# Patient Record
Sex: Female | Born: 1970 | Race: White | Hispanic: No | Marital: Married
Health system: Southern US, Community
[De-identification: ages and names within clinical notes are randomized; demographics above are authoritative.]

---

## 2006-03-25 ENCOUNTER — Emergency Department: Payer: Self-pay | Admitting: Emergency Medicine

## 2006-06-08 ENCOUNTER — Observation Stay: Payer: Self-pay | Admitting: Obstetrics and Gynecology

## 2006-06-10 ENCOUNTER — Observation Stay: Payer: Self-pay | Admitting: Obstetrics and Gynecology

## 2006-07-14 ENCOUNTER — Ambulatory Visit: Payer: Self-pay | Admitting: Obstetrics and Gynecology

## 2006-08-17 ENCOUNTER — Inpatient Hospital Stay: Payer: Self-pay | Admitting: Obstetrics and Gynecology

## 2006-08-23 ENCOUNTER — Ambulatory Visit: Payer: Self-pay | Admitting: Pediatrics

## 2011-09-18 ENCOUNTER — Ambulatory Visit: Payer: Self-pay | Admitting: Obstetrics and Gynecology

## 2011-09-19 ENCOUNTER — Ambulatory Visit: Payer: Self-pay | Admitting: Obstetrics and Gynecology

## 2011-11-13 ENCOUNTER — Ambulatory Visit: Payer: Self-pay | Admitting: Anesthesiology

## 2011-11-13 DIAGNOSIS — I10 Essential (primary) hypertension: Secondary | ICD-10-CM

## 2011-11-13 LAB — CBC WITH DIFFERENTIAL/PLATELET
Basophil #: 0.1 10*3/uL (ref 0.0–0.1)
Eosinophil #: 0.2 10*3/uL (ref 0.0–0.7)
Eosinophil %: 2.1 %
HCT: 35.8 % (ref 35.0–47.0)
Lymphocyte %: 35.5 %
MCV: 84 fL (ref 80–100)
Monocyte %: 6.4 %
Neutrophil #: 4.1 10*3/uL (ref 1.4–6.5)
Neutrophil %: 54.9 %
Platelet: 281 10*3/uL (ref 150–440)
RDW: 15.3 % — ABNORMAL HIGH (ref 11.5–14.5)
WBC: 7.4 10*3/uL (ref 3.6–11.0)

## 2011-11-13 LAB — BASIC METABOLIC PANEL
BUN: 10 mg/dL (ref 7–18)
Calcium, Total: 9 mg/dL (ref 8.5–10.1)
Chloride: 104 mmol/L (ref 98–107)
Co2: 24 mmol/L (ref 21–32)
Glucose: 113 mg/dL — ABNORMAL HIGH (ref 65–99)
Osmolality: 279 (ref 275–301)
Potassium: 3.5 mmol/L (ref 3.5–5.1)
Sodium: 140 mmol/L (ref 136–145)

## 2011-11-19 ENCOUNTER — Ambulatory Visit: Payer: Self-pay | Admitting: Surgery

## 2011-11-19 LAB — PREGNANCY, URINE: Pregnancy Test, Urine: NEGATIVE m[IU]/mL

## 2011-11-24 LAB — PATHOLOGY REPORT

## 2012-05-26 ENCOUNTER — Ambulatory Visit: Payer: Self-pay | Admitting: Surgery

## 2013-05-27 ENCOUNTER — Ambulatory Visit: Payer: Self-pay | Admitting: Family Medicine

## 2014-02-18 IMAGING — US ULTRASOUND RIGHT BREAST
1 series · 14 of 25 positions shown · non-contrast
Comparison: none

REASON FOR EXAM: AV RT PARENCHYMAL DENSITY
COMMENTS:

PROCEDURE:     US  - US BREAST RIGHT  - September 19, 2011 [DATE]
RESULT:     Reference is made to mammogram report which incorporates
ultrasound findings.

[Series 1: ultrasound right breast · 0.08mm/px · 32 acquisitions, 14 frames shown]
[im 1/32]
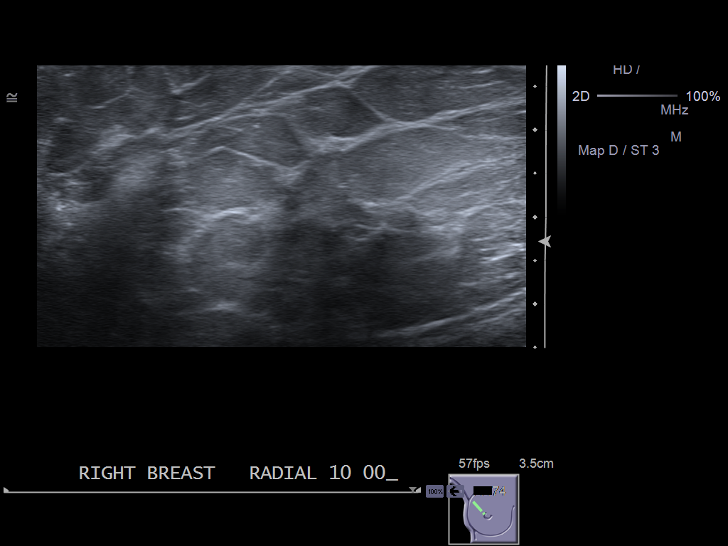
[im 3/32]
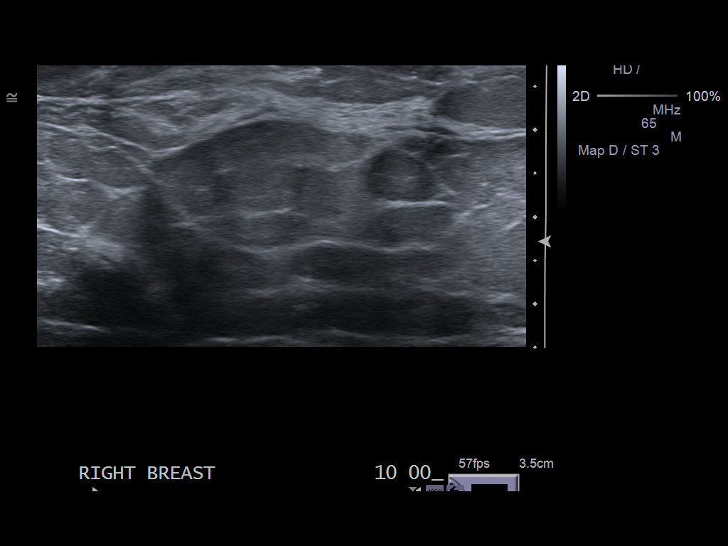
[im 6/32]
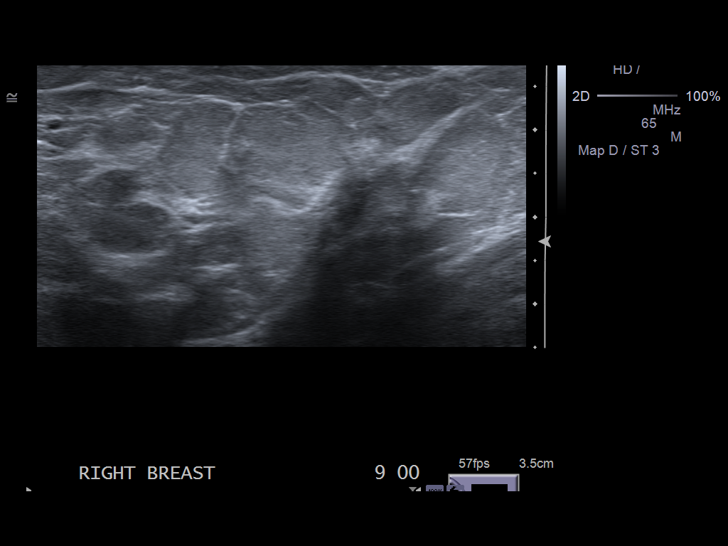
[im 8/32]
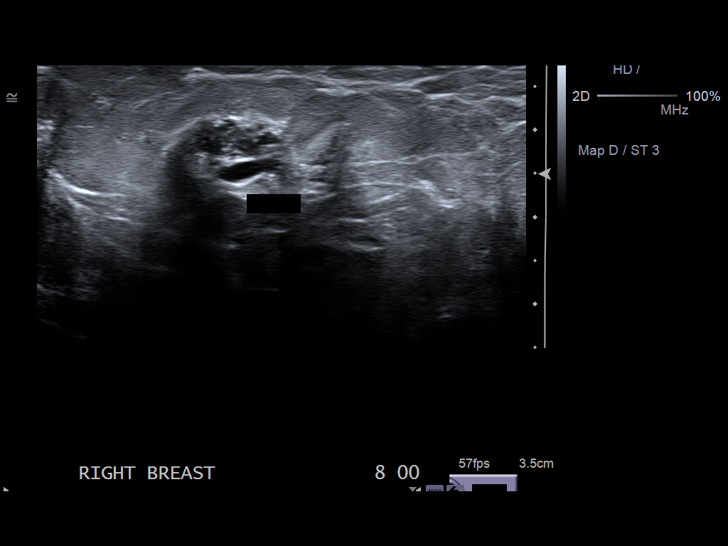
[im 11/32]
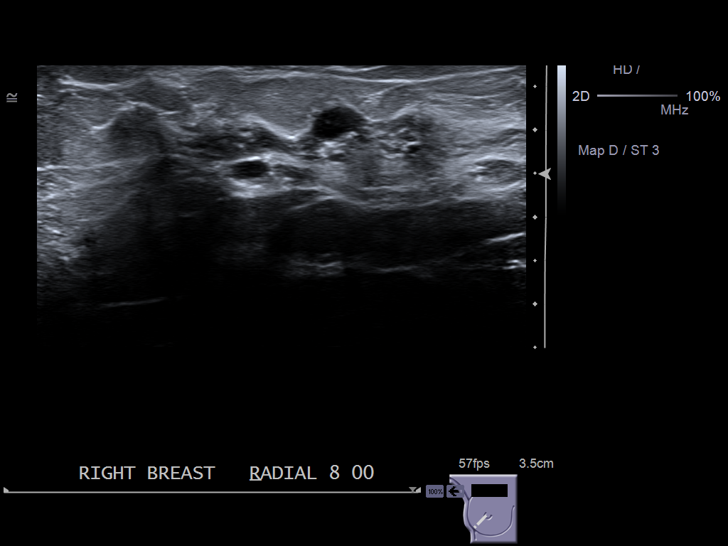
[im 12/32]
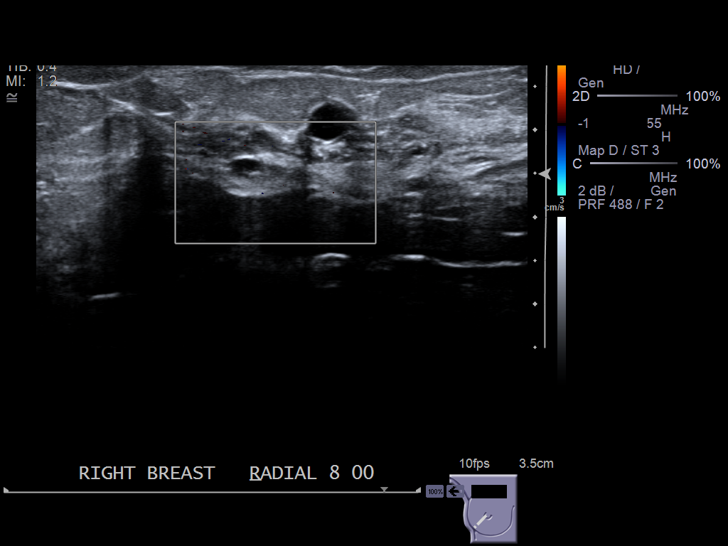
[im 15/32]
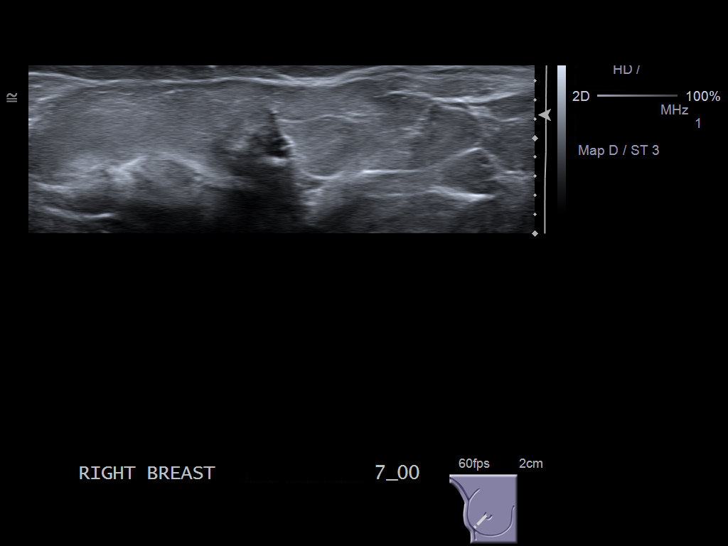
[im 17/32]
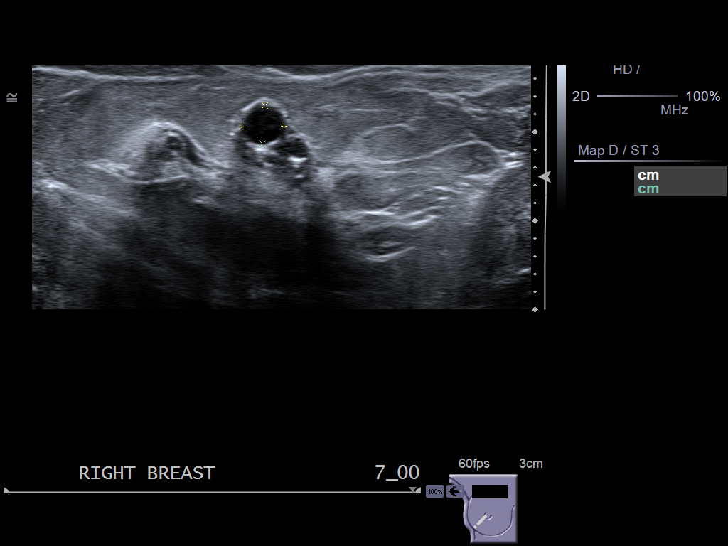
[im 20/32]
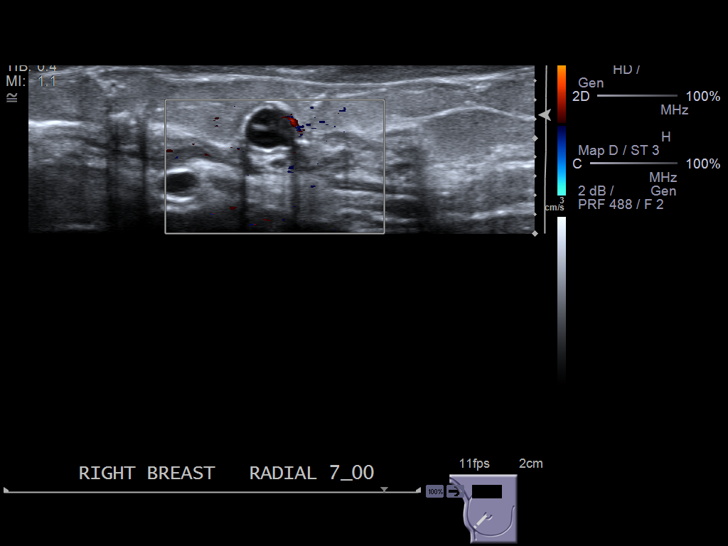
[im 21/32]
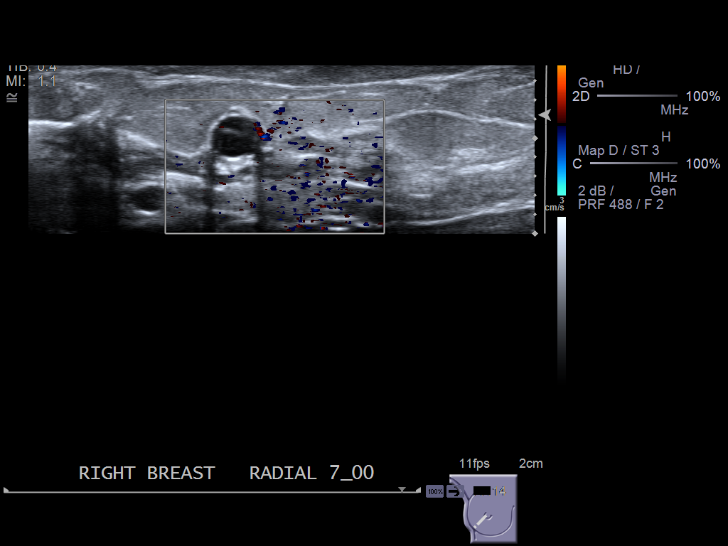
[im 24/32]
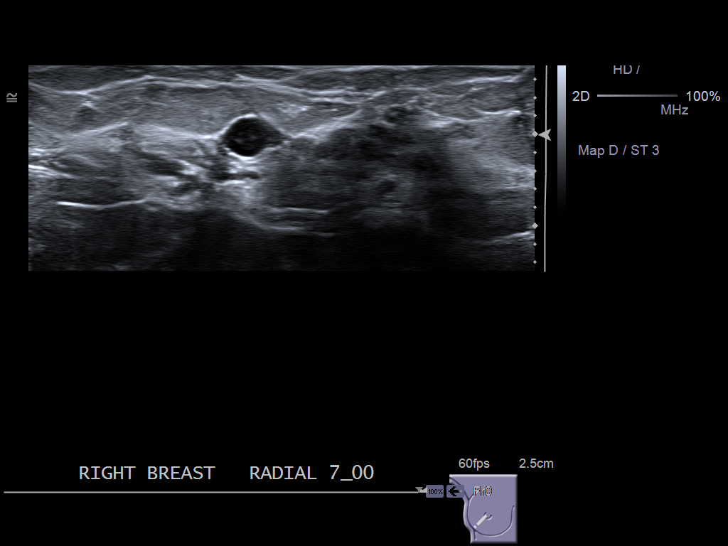
[im 26/32]
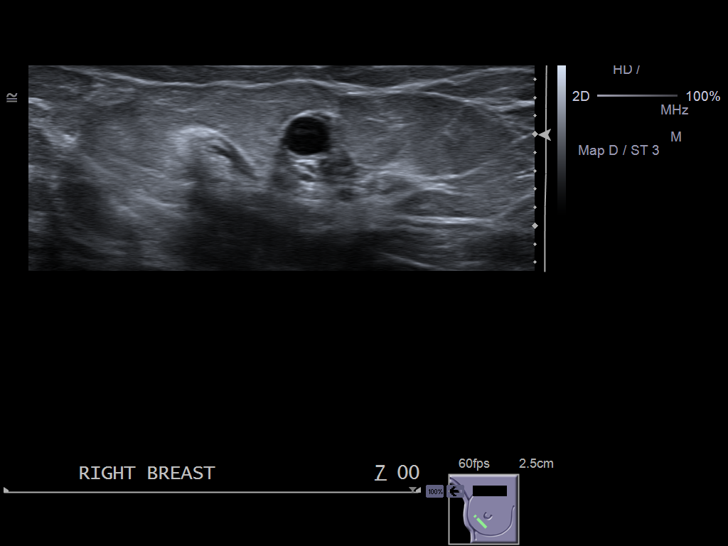
[im 29/32]
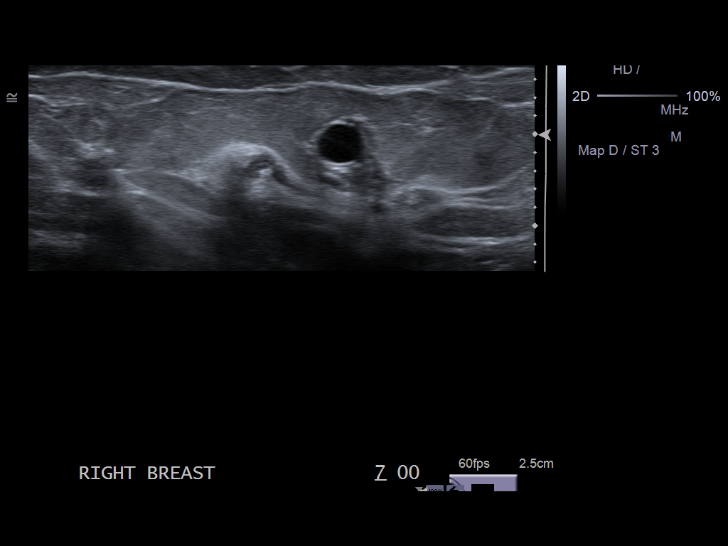
[im 32/32]
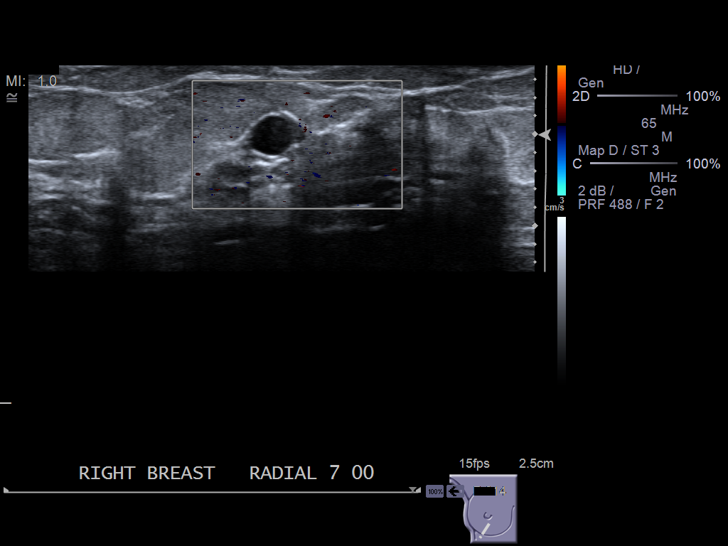

[14 of 25 positions shown; findings below may reference images not displayed]

IMPRESSION: BI-RADS: Category 4 - Suspicious Abnormality.

## 2014-04-20 IMAGING — US US NEEDLE LOCALIZATION*R*
1 series · 13 of 14 positions shown · non-contrast
Comparison: none

REASON FOR EXAM: right breast mass
COMMENTS:

[Series 1: us needle localization*right* · 0.08mm/px · 14 acquisitions, 13 frames shown]
[im 1/14]
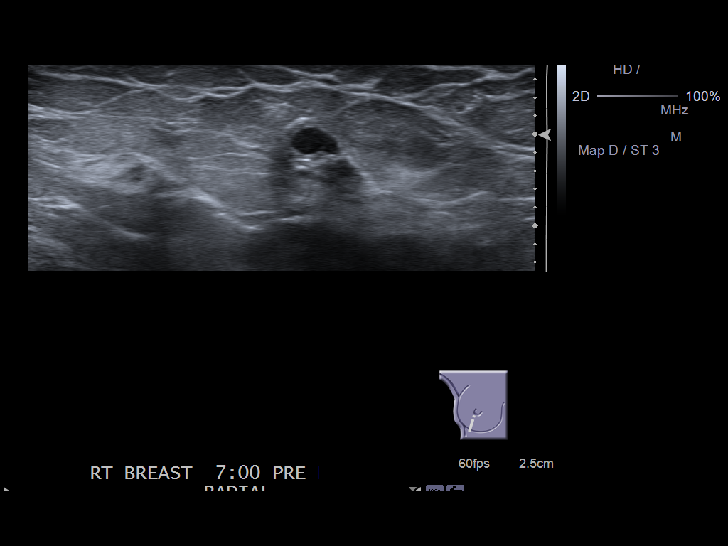
[im 2/14]
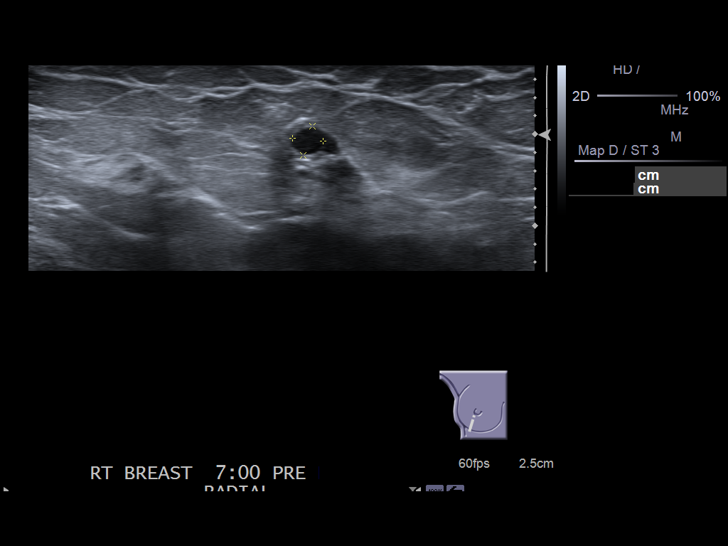
[im 3/14]
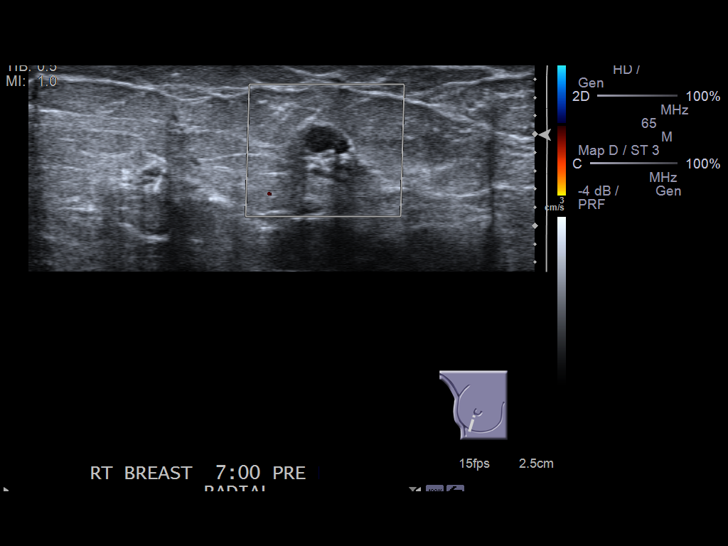
[im 4/14]
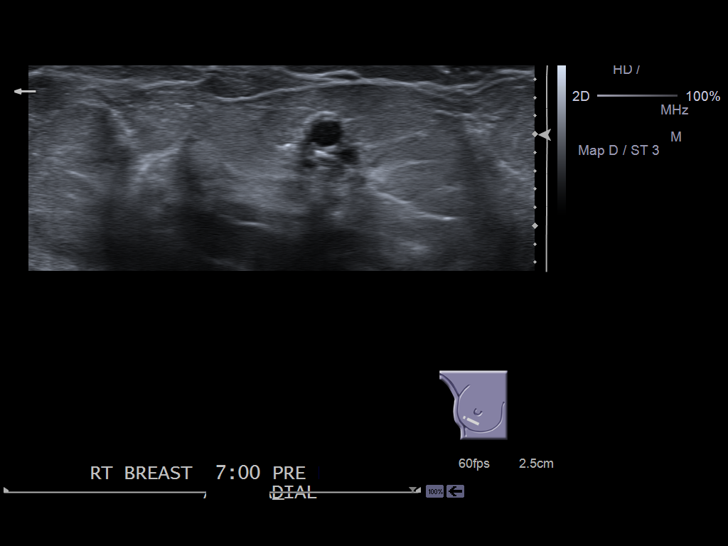
[im 5/14]
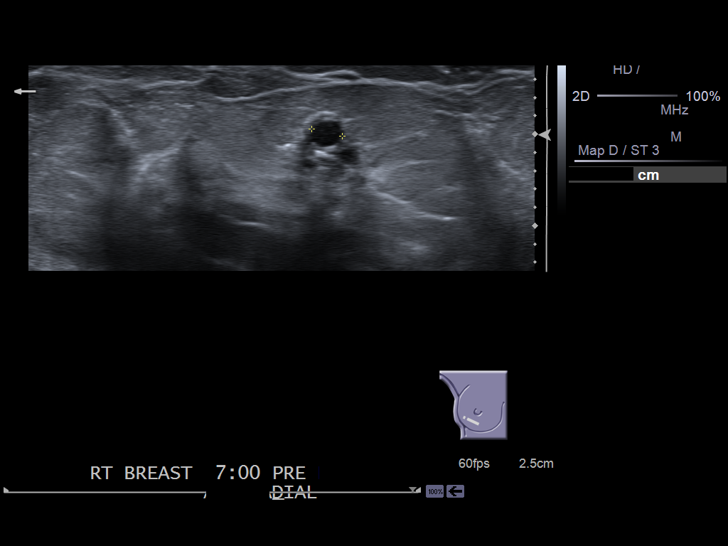
[im 6/14]
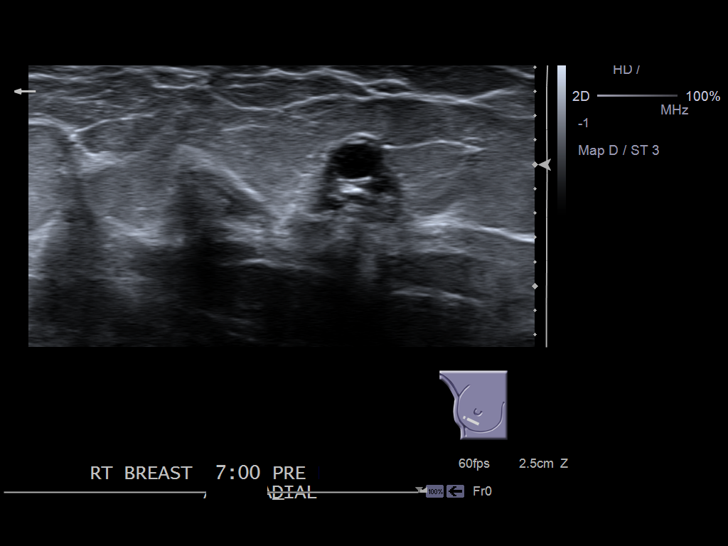
[im 8/14]
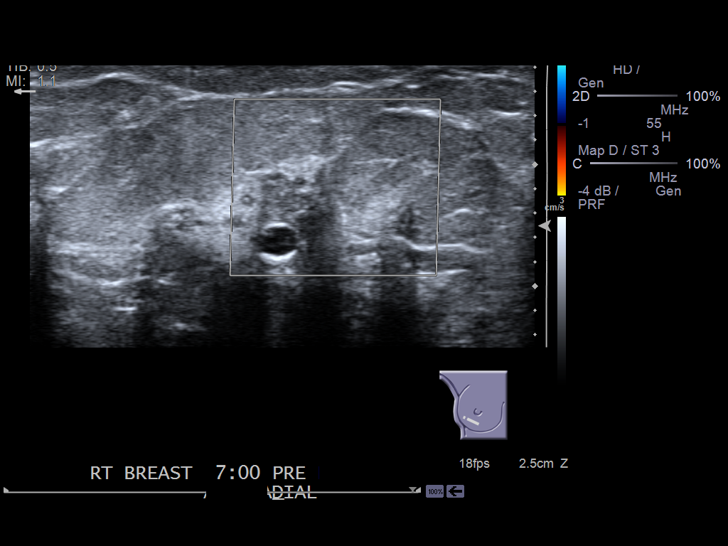
[im 9/14]
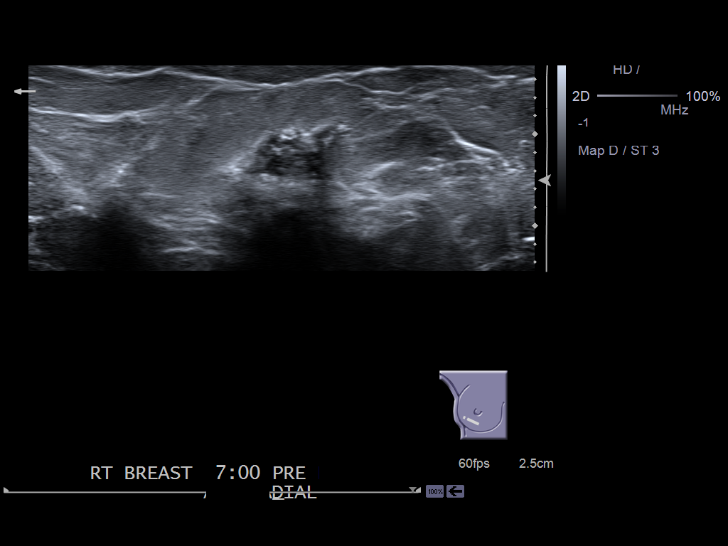
[im 10/14]
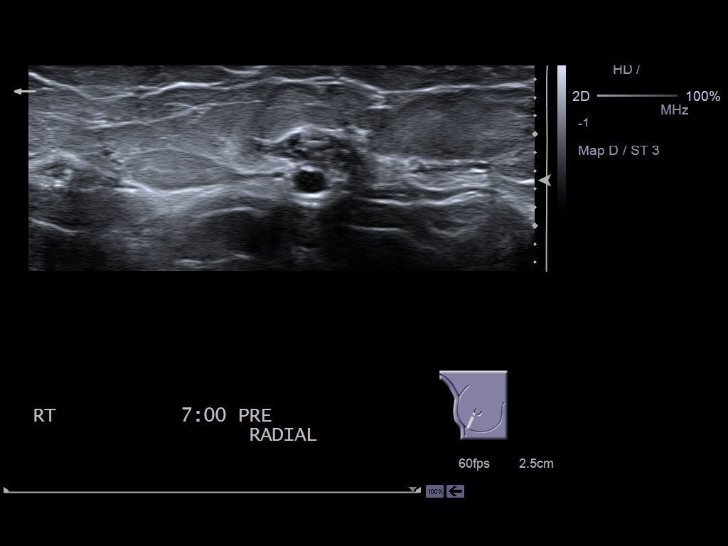
[im 11/14]
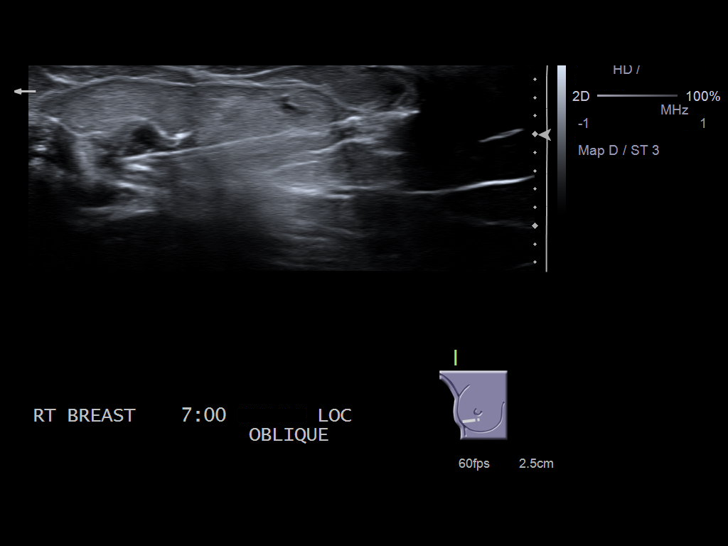
[im 12/14]
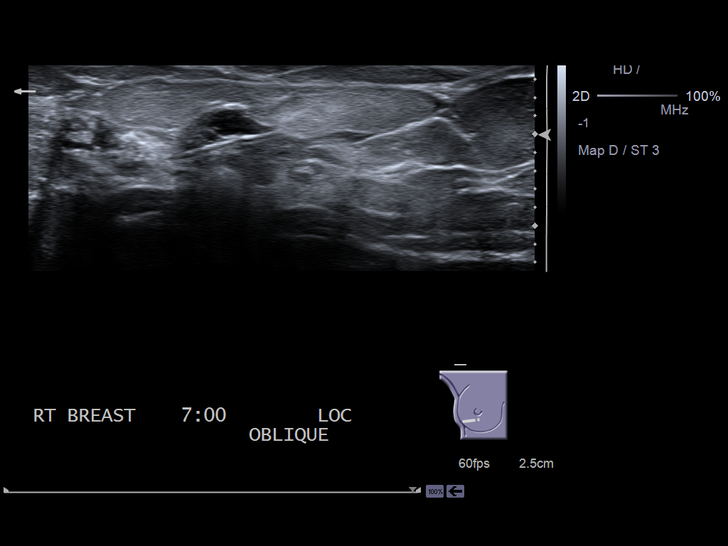
[im 13/14]
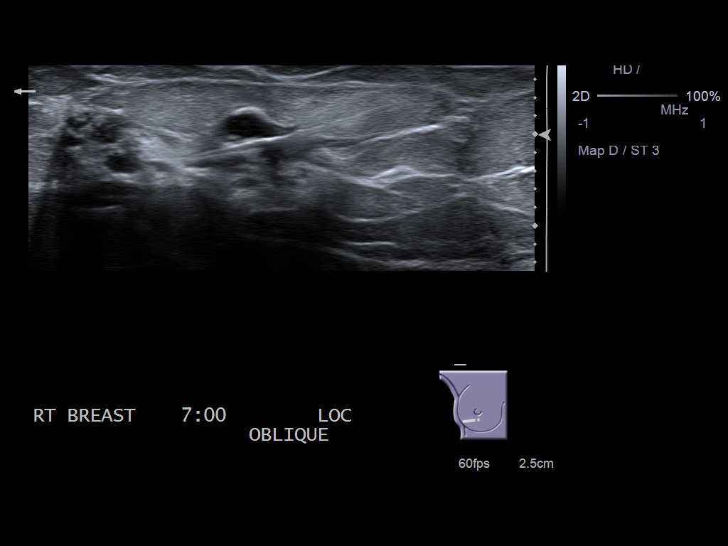
[im 14/14]
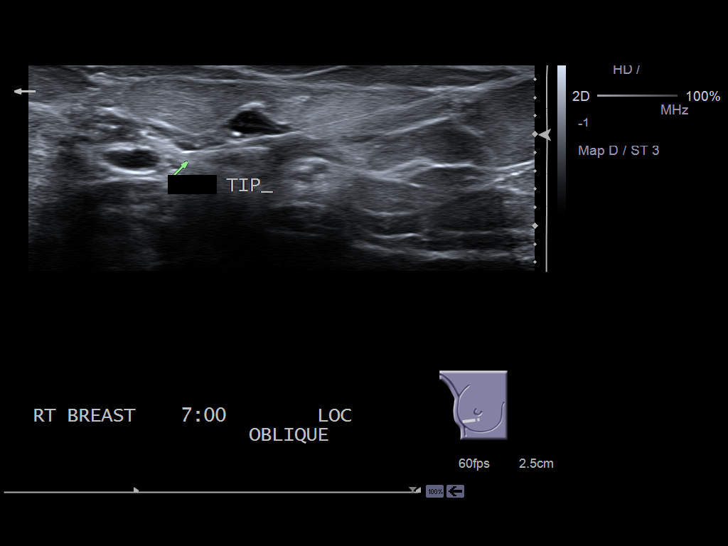

[13 of 14 positions shown; findings below may reference images not displayed]

PROCEDURE:     US  - US GUIDED NEEDLE LOCAL R BREAST  - November 19, 2011 [DATE]

RESULT:     The anticipated procedure was discussed with Ms. Hekimkosmetoloq. She
voiced her willingness to proceed. A timeout procedure was called. The skin
of the lateral aspect of the right breast was marked with an indelible
marker. The patient voiced no contraindication to the use of cutaneous
iodine based antiseptic snorted subcutaneous lidocaine.

There are mammographic guidance a complex appearing largely cystic structure
was demonstrated at approximately the [DATE] position which corresponded to
the lesion previously identified. The skin was cleansed with the antiseptic
solution in the subcutaneous tissues infiltrated approximately 2 cc of 1%
buffered lidocaine. Subsequently the 9 centimeter Niyos Eem needle and
hookwire were introduced. Under mammographic guidance the wire was placed
such that its thickened portion lay immediately deep to the lesion in
question. The carrier needle was withdrawn. The patient tolerated the
procedure well.
IMPRESSION: The patient underwent successful ultrasound directed needle
localization of a lesion in the [DATE] position of the right breast. The
patient was sent to [REDACTED] in good condition.

[REDACTED]

## 2014-07-18 NOTE — H&P (Signed)
PATIENT NAME:  Ariana ArbourBOST, Ariana Beltran MR#:  [id number] DATE OF BIRTH:  Oct 08, 1970  DATE OF ADMISSION:  11/19/2011  CHIEF COMPLAINT: Abnormal right mammogram.   HISTORY OF PRESENT ILLNESS: This is a patient with an abnormal right mammogram and ultrasound suggestive of dilated ducts but there has been a persistent area that is somewhat suspicious in nature and because of its unchanging status over several weeks it requires needle localization breast biopsy. She is here for elective right needle localization breast biopsy.   PAST MEDICAL HISTORY: None.   PAST SURGICAL HISTORY: Cesarean section.   ALLERGIES: Sulfa.   FAMILY HISTORY: Noncontributory.   SOCIAL HISTORY: The patient is not employed. Does not smoke or drink.   REVIEW OF SYSTEMS: 10 system review has been performed and documented in the office chart.   FAMILY HISTORY: Noncontributory.   PHYSICAL EXAMINATION:   GENERAL: Healthy female patient, 244 pounds.   VITAL SIGNS: Stable.   HEENT: No scleral icterus.   NECK: No palpable neck nodes.   CHEST: Clear to auscultation.   CARDIAC: Regular rate and rhythm.   ABDOMEN: Soft, nontender.   EXTREMITIES: Without edema. Calves are nontender.   NEUROLOGIC: Grossly intact.   INTEGUMENTARY: No jaundice.   BREASTS: No palpable mass in either breast.   Ultrasounds shows cystic disease as well as probable dilated ducts somewhat suspicious in the right breast.   LABORATORY VALUES: Hemoglobin and hematocrit of 11.8 and 36, platelet count of 281. Electrolytes are within normal limits.   ASSESSMENT AND PLAN: This is a patient with an abnormal right mammogram and ultrasound probably suggestive of dilated ducts but because of their somewhat suspicious nature and persistence on multiple exams, I have recommended needle localization breast biopsy. The rationale for this has been discussed. The options of observation have been reviewed and the risks of bleeding, infection, recurrence, missed  lesion, and cosmetic deformity including hematoma and seroma have all been reviewed with she and her husband. They understood and agreed to proceed.   ____________________________ Adah Salvageichard E. Excell Seltzerooper, MD rec:drc D: 11/18/2011 11:31:00 ET Beltran: 11/18/2011 11:44:58 ET JOB#: [id number]  cc: Adah Salvageichard E. Excell Seltzerooper, MD, <Dictator> Lattie HawICHARD E Grier Czerwinski MD ELECTRONICALLY SIGNED 11/18/2011 15:34

## 2014-07-18 NOTE — Op Note (Signed)
PATIENT NAME:  Dossie ArbourBOST, Korra T MR#:  [id number] DATE OF BIRTH:  [birth year]  DATE OF PROCEDURE:  11/19/2011  PREOPERATIVE DIAGNOSIS: Abnormal right mammogram.  POSTOPERATIVE DIAGNOSIS:  Abnormal right mammogram.  PROCEDURE:  Right needle localization breast biopsy.   SURGEON: Dionne Miloichard Okechukwu Regnier, M.D.   ASSISTANT:  Wallace KellerLauren Weinberg, PA-S  ANESTHESIA: General with LMA.  INDICATIONS: This is a patient with an abnormal mammogram on the right requiring biopsy. Preoperatively we discussed the rationale for surgery, the options of observation, risks of bleeding, infection, recurrence, the need for additional surgery, and cosmetic deformity including hematoma and seroma. This was all reviewed for her and her husband in the preop holding area. They understood and agreed to proceed.   FINDINGS: Likely fibrocystic disease with dilated ducts and small cysts.   DESCRIPTION OF PROCEDURE: Patient was induced to general anesthesia, prepped and draped in sterile fashion. The breast that contained a needle localization wire was identified. Local anesthetic was infiltrated into the skin and subcutaneous tissues around the previously placed needle localization wire. An incision was made and dissection down to the end of the wire was performed. The tissue was elevated and inspected and found to contain multiple small cysts and dilated ducts.  These cysts measured approximately 6 or 7 mm or smaller, multiple. Palpation of the walls of the cavity failed to identify any abnormalities or any suspicious lesions. Hemostasis was with electrocautery. Additional Marcaine was placed for a total of 30 mL. Hemostasis was checked multiple times, and once assuring that it was adequate the wound was closed in layers with deep sutures of 3-0 Vicryl followed by 4-0 subcuticular Monocryl. Steri-Strips, Mastisol, and sterile dressings were placed.   The patient tolerated the procedure well. There were no complications. She was taken to the  recovery room in stable condition to be discharged in the care of her family. Follow up in 10 days.   ____________________________ Adah Salvageichard E. Excell Seltzerooper, MD rec:bjt D: 11/19/2011 14:04:42 ET T: 11/19/2011 15:21:48 ET JOB#: [id number]  cc: Adah Salvageichard E. Excell Seltzerooper, MD, <Dictator> Lattie HawICHARD E Cordelia Bessinger MD ELECTRONICALLY SIGNED 11/22/2011 2:01

## 2015-10-27 IMAGING — CR DG CHEST 2V
1 series · 2 of 2 positions shown · non-contrast
Comparison: None.

CLINICAL DATA: Cough

EXAM:
CHEST  2 VIEW

[Series 1: pa · 0.17mm/px · 2 of 2 slices shown]
[im 1/2]
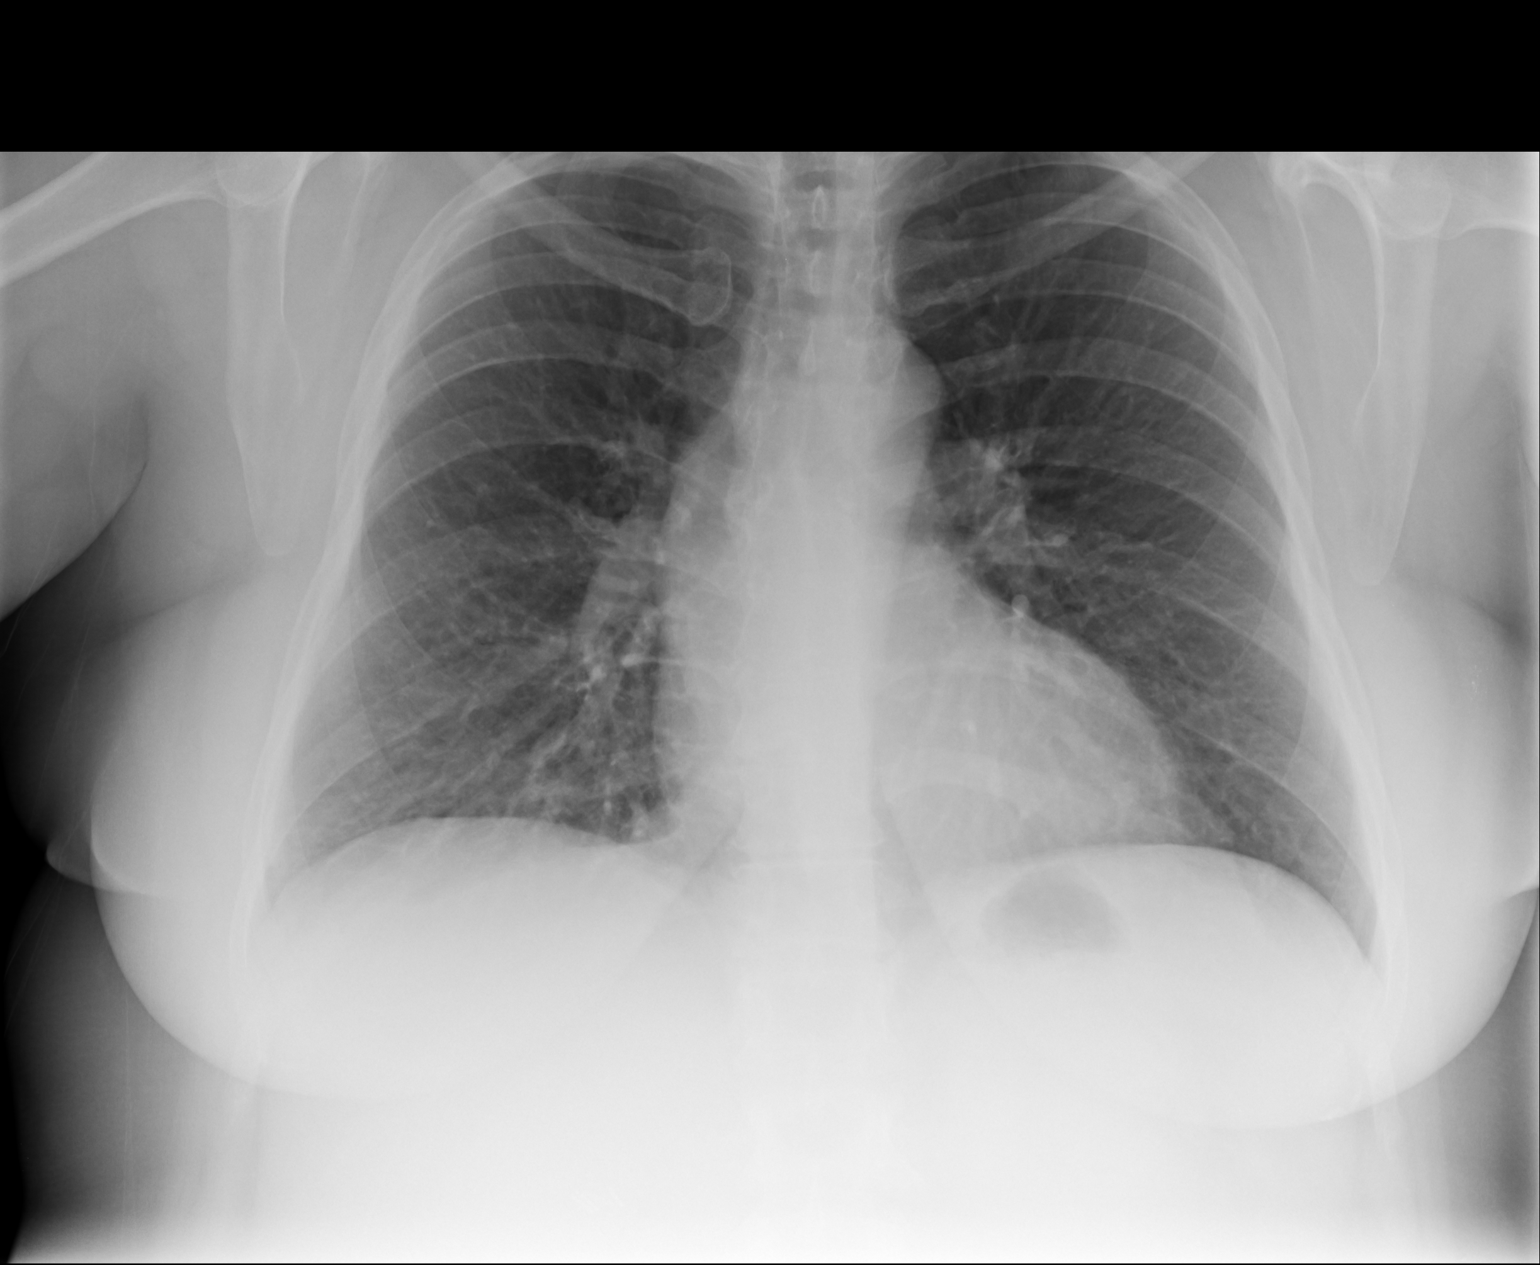
[im 2/2]
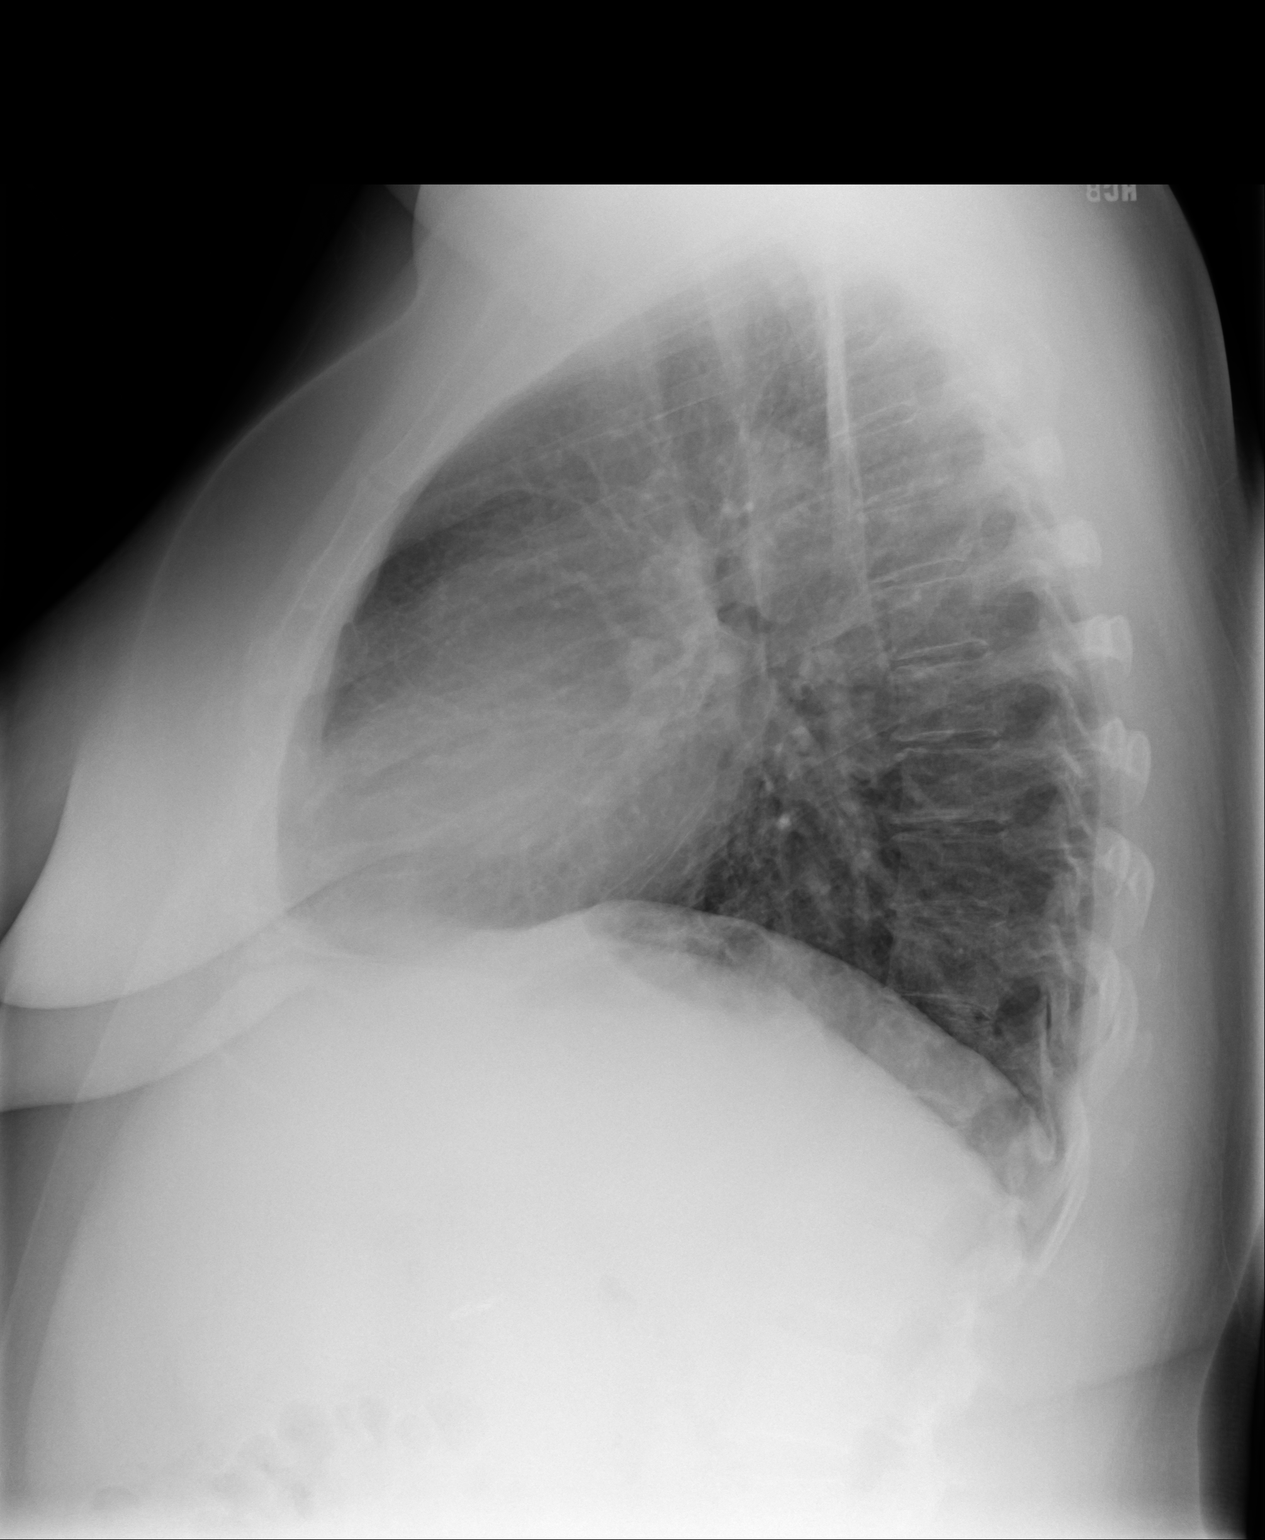

[2 of 2 positions shown; findings below may reference images not displayed]

FINDINGS: Patient rotated to the left. Normal cardiac contours. Mild
tortuosity thoracic aorta. No consolidative pulmonary opacities. No
pleural effusion or pneumothorax. Regional skeleton is unremarkable.
Cholecystectomy clips.
IMPRESSION: No acute cardiopulmonary process.
